# Patient Record
Sex: Male | Born: 1975 | Race: Black or African American | Hispanic: No | Marital: Single | Smoking: Never smoker
Health system: Southern US, Community
[De-identification: ages and names within clinical notes are randomized; demographics above are authoritative.]

## PROBLEM LIST (undated history)

## (undated) DIAGNOSIS — I10 Essential (primary) hypertension: Secondary | ICD-10-CM

## (undated) HISTORY — DX: Essential (primary) hypertension: I10

---

## 2004-07-04 ENCOUNTER — Emergency Department (HOSPITAL_COMMUNITY): Admission: EM | Admit: 2004-07-04 | Discharge: 2004-07-04 | Payer: Self-pay | Admitting: Emergency Medicine
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
## 2019-06-23 ENCOUNTER — Ambulatory Visit: Payer: Self-pay | Admitting: Registered Nurse

## 2019-06-24 ENCOUNTER — Encounter: Payer: Self-pay | Admitting: Registered Nurse

## 2019-07-21 ENCOUNTER — Other Ambulatory Visit: Payer: Self-pay

## 2019-07-21 ENCOUNTER — Ambulatory Visit (INDEPENDENT_AMBULATORY_CARE_PROVIDER_SITE_OTHER): Payer: 59 | Admitting: Family Medicine

## 2019-07-21 VITALS — BP 175/115 | HR 98 | Temp 98.4°F | Ht 63.0 in | Wt 171.6 lb

## 2019-07-21 DIAGNOSIS — H9319 Tinnitus, unspecified ear: Secondary | ICD-10-CM | POA: Insufficient documentation

## 2019-07-21 DIAGNOSIS — I10 Essential (primary) hypertension: Secondary | ICD-10-CM | POA: Insufficient documentation

## 2019-07-21 DIAGNOSIS — R9431 Abnormal electrocardiogram [ECG] [EKG]: Secondary | ICD-10-CM | POA: Diagnosis not present

## 2019-07-21 LAB — POCT URINALYSIS DIP (CLINITEK)
Bilirubin, UA: NEGATIVE
Glucose, UA: NEGATIVE mg/dL
Ketones, POC UA: NEGATIVE mg/dL
Leukocytes, UA: NEGATIVE
Nitrite, UA: NEGATIVE
POC PROTEIN,UA: 30 — AB
Spec Grav, UA: 1.025 (ref 1.010–1.025)
Urobilinogen, UA: 1 E.U./dL
pH, UA: 5.5 (ref 5.0–8.0)

## 2019-07-21 MED ORDER — AMLODIPINE BESYLATE 5 MG PO TABS: 5.0000 mg | ORAL_TABLET | Freq: Every day | ORAL | 1 refills | Status: DC

## 2019-07-21 NOTE — Patient Instructions (Addendum)
Blood pressure-take daily in the morning and write readings  Fasting labwork Cardiology referral

## 2019-07-21 NOTE — Progress Notes (Signed)
New Patient Office Visit  Subjective:  Patient ID: Martin Oconnor, male    DOB: Aug 22, 1975  Age: 43 y.o. MRN: [id number]  CC:  Chief Complaint  Patient presents with  . Establish Care  . Hypertension    possible/did not pass a job interview due to high blood pressure 150/105  . Tinnitus    left ear more than right    HPI Martin Oconnor presents for elevated blood pressure-no medication,  +visual changes, +headaches,+ tinnitus. Pt with HTN diagnosed in 2008 -no current medications. No blood pressure check until pt completed physical exam for employment. Pt was denied a physical due to elevated blood pressure  No FH HTN  Social History  Works in Set designer Socioeconomic History  . Marital status: Single    Spouse name: Not on file  . Number of children: Not on file  . Years of education: Not on file  . Highest education level: Not on file  Occupational History  . Not on file  Social Needs  . Financial resource strain: Not on file  . Food insecurity    Worry: Not on file    Inability: Not on file  . Transportation needs    Medical: Not on file    Non-medical: Not on file  Tobacco Use  . Smoking status: Not on file  Substance and Sexual Activity  . Alcohol use: Not on file  . Drug use: Not on file  . Sexual activity: Not on file  Lifestyle  . Physical activity    Days per week: Not on file    Minutes per session: Not on file  . Stress: Not on file  Relationships  . Social Musician on phone: Not on file    Gets together: Not on file    Attends religious service: Not on file    Active member of club or organization: Not on file    Attends meetings of clubs or organizations: Not on file    Relationship status: Not on file  . Intimate partner violence    Fear of current or ex partner: Not on file    Emotionally abused: Not on file    Physically abused: Not on file    Forced sexual activity: Not on file  Other Topics Concern  . Not on file   Social History Narrative  . Not on file     Current Problems (verified) Elevated blood pressure  Medications Prior to Visit none  Current Medications (verified) none   Allergies (verified) none  PAST HISTORY  Family History No FH HTN  Social History Social History   Tobacco Use  . Smoking status: Not on file  Substance Use Topics  . Alcohol use: Not on file   Screening Tests Health Maintenance  Topic Date Due  . HIV Screening  10/16/1988  . TETANUS/TDAP  10/16/1992  . INFLUENZA VACCINE  03/22/2019       Objective:  Body mass index is 30.4 kg/m. Today's Vitals   07/21/19 1506  BP: (!) 175/115  Pulse: 98  Temp: 98.4 F (36.9 C)  TempSrc: Oral  SpO2: 97%  Weight: 171 lb 9.6 oz (77.8 kg)  Height: 5\' 3"  (1.6 m)   Body mass index is 30.4 kg/m. {      Review of Systems  Constitutional: Negative.   HENT: Positive for tinnitus.   Eyes: Positive for visual disturbance.  Respiratory: Negative.   Cardiovascular: Negative.   Gastrointestinal: Negative.   Endocrine:  Negative.   Genitourinary: Negative.   Musculoskeletal: Negative.   Skin: Negative.   Allergic/Immunologic: Negative.   Neurological: Negative.   Hematological: Negative.   Psychiatric/Behavioral: Negative.     Objective:   Today's Vitals: BP (!) 175/115 (BP Location: Left Arm, Patient Position: Sitting, Cuff Size: Normal)   Pulse 98   Temp 98.4 F (36.9 C) (Oral)   Ht 5\' 3"  (1.6 m)   Wt 171 lb 9.6 oz (77.8 kg)   SpO2 97%   BMI 30.40 kg/m   Physical Exam Vitals signs reviewed.  Constitutional:      Appearance: Normal appearance.  HENT:     Head: Normocephalic and atraumatic.  Eyes:     Conjunctiva/sclera: Conjunctivae normal.  Neck:     Musculoskeletal: Normal range of motion and neck supple.  Cardiovascular:     Rate and Rhythm: Normal rate and regular rhythm.     Pulses: Normal pulses.     Heart sounds: Normal heart sounds.  Pulmonary:     Effort: Pulmonary effort  is normal.     Breath sounds: Normal breath sounds.  Musculoskeletal: Normal range of motion.  Neurological:     Mental Status: He is alert.  Psychiatric:        Mood and Affect: Mood normal.        Behavior: Behavior normal.     Assessment & Plan:  1. Essential hypertension Amlodipine 5mg -rx-risk/benefit/side effects - EKG 12-Lead - CBC with Differential - COMPLETE METABOLIC PANEL WITH GFR - TSH - Lipid panel - POCT URINALYSIS DIP (CLINITEK) - Ambulatory referral to Cardiology SR with T wave abnormality-V2-V6, III 2. Tinnitus, unspecified laterality Concern relate to elevated bp 3. Abnormal ECG  Follow-up: cardio  LISA Hannah Beat, MD

## 2019-07-25 ENCOUNTER — Encounter: Payer: Self-pay | Admitting: *Deleted

## 2019-07-25 ENCOUNTER — Ambulatory Visit: Payer: 59 | Admitting: Cardiology

## 2019-07-25 ENCOUNTER — Encounter: Payer: Self-pay | Admitting: Cardiology

## 2019-07-25 NOTE — Progress Notes (Deleted)
No show

## 2019-08-18 ENCOUNTER — Encounter: Payer: Self-pay | Admitting: Family Medicine

## 2019-08-18 ENCOUNTER — Telehealth (INDEPENDENT_AMBULATORY_CARE_PROVIDER_SITE_OTHER): Payer: 59 | Admitting: Family Medicine

## 2019-08-18 ENCOUNTER — Other Ambulatory Visit: Payer: Self-pay

## 2019-08-18 VITALS — BP 145/91 | HR 80 | Ht 63.0 in | Wt 170.0 lb

## 2019-08-18 DIAGNOSIS — R9431 Abnormal electrocardiogram [ECG] [EKG]: Secondary | ICD-10-CM

## 2019-08-18 DIAGNOSIS — I10 Essential (primary) hypertension: Secondary | ICD-10-CM

## 2019-08-18 MED ORDER — LOSARTAN POTASSIUM 50 MG PO TABS: 50.0000 mg | ORAL_TABLET | Freq: Every day | ORAL | 1 refills | Status: DC

## 2019-08-18 NOTE — Progress Notes (Signed)
Virtual Visit via Telephone Note  I connected with Martin Oconnor on 08/18/19 at  1:40 PM EST by telephone and verified that I am speaking with the correct person using two identifiers.DOB/address  Location: Patient: home Provider:clinic   I discussed the limitations, risks, security and privacy concerns of performing an evaluation and management service by telephone and the availability of in person appointments. I also discussed with the patient that there may be a patient responsible charge related to this service. The patient expressed understanding and agreed to proceed.   History of Present Illness: Pt took amlodipine for blood pressure -noted rash on the back of the hand-itchy and irritated. Pt states burning sensation not noted prior to starting medication. Pt went off medication and rash almost gone.  Pt has not taken blood pressure medications in the past. Pt has not completed labwork   Observations/Objective: 145/90, 132/88-home bp  Assessment and Plan:  1. Essential hypertension Stop amlodipine-rash noted on hands, start Lotensin 50mg  daily labwork-fasting  2. Abnormal ECG Cardio evaluation next week Follow Up Instructions: Fasting labwork   I discussed the assessment and treatment plan with the patient. The patient was provided an opportunity to ask questions and all were answered. The patient agreed with the plan and demonstrated an understanding of the instructions.   The patient was advised to call back or seek an in-person evaluation if the symptoms worsen or if the condition fails to improve as anticipated.  I provided 8 minutes of non-face-to-face time during this encounter.   Kianna Billet Hannah Beat, MD

## 2019-08-19 NOTE — Progress Notes (Deleted)
CARDIOLOGY CONSULT NOTE       Patient ID: Martin Oconnor MRN: [id number] DOB/AGE: 1976-02-16 43 y.o.  Admit date: (Not on file) Referring Physician: Corum Primary Physician: Maryruth Hancock, MD Primary Cardiologist: new Reason for Consultation: Abnormal ECG  Active Problems:   * No active hospital problems. *   HPI:  43 y.o. with HTN. Referred by Dr Holly Bodily for abnormal ECG. Recently BP Rx with norvasc but patient developed a rash and stopped. No Rx with Lotensin 50 mg daily She has long standing HTN as far back as 2008 She has not been on meds regularly Only established care with primary in November. Denied employment on exam when found to be poorly controlled BP.  She has had some issues with tinnitus and blurry vision   ROS All other systems reviewed and negative except as noted above  Past Medical History:  Diagnosis Date  . Uncontrolled hypertension     No family history on file.  Social History   Socioeconomic History  . Marital status: Single    Spouse name: Not on file  . Number of children: Not on file  . Years of education: Not on file  . Highest education level: Not on file  Occupational History  . Not on file  Tobacco Use  . Smoking status: Not on file  Substance and Sexual Activity  . Alcohol use: Not Currently  . Drug use: Never  . Sexual activity: Yes  Other Topics Concern  . Not on file  Social History Narrative  . Not on file   Social Determinants of Health   Financial Resource Strain:   . Difficulty of Paying Living Expenses: Not on file  Food Insecurity:   . Worried About Charity fundraiser in the Last Year: Not on file  . Ran Out of Food in the Last Year: Not on file  Transportation Needs:   . Lack of Transportation (Medical): Not on file  . Lack of Transportation (Non-Medical): Not on file  Physical Activity:   . Days of Exercise per Week: Not on file  . Minutes of Exercise per Session: Not on file  Stress:   . Feeling of Stress :  Not on file  Social Connections:   . Frequency of Communication with Friends and Family: Not on file  . Frequency of Social Gatherings with Friends and Family: Not on file  . Attends Religious Services: Not on file  . Active Member of Clubs or Organizations: Not on file  . Attends Archivist Meetings: Not on file  . Marital Status: Not on file  Intimate Partner Violence:   . Fear of Current or Ex-Partner: Not on file  . Emotionally Abused: Not on file  . Physically Abused: Not on file  . Sexually Abused: Not on file    *** The histories are not reviewed yet. Please review them in the "History" navigator section and refresh this [place].      Physical Exam: There were no vitals taken for this visit.    Affect appropriate Healthy:  appears stated age 25: normal Neck supple with no adenopathy JVP normal no bruits no thyromegaly Lungs clear with no wheezing and good diaphragmatic motion Heart:  S1/S2 no murmur, no rub, gallop or click PMI normal Abdomen: benighn, BS positve, no tenderness, no AAA no bruit.  No HSM or HJR Distal pulses intact with no bruits No edema Neuro non-focal Skin warm and dry No muscular weakness    Radiology: No  results found.  EKG: ***   ASSESSMENT AND PLAN:   1. HTN:  *** 2. Abnormal ECG:  ***  Signed: Charlton Haws 08/19/2019, 5:42 PM

## 2019-08-27 ENCOUNTER — Ambulatory Visit: Payer: 59 | Admitting: Cardiovascular Disease

## 2019-08-28 ENCOUNTER — Encounter: Payer: Self-pay | Admitting: Cardiovascular Disease

## 2019-09-01 ENCOUNTER — Telehealth: Payer: Self-pay | Admitting: Family Medicine

## 2019-09-01 NOTE — Telephone Encounter (Signed)
Patient is calling and states he started the losartan (COZAAR) 50 MG tablet  And states 2 hours later he started feeling strange, as in he was feeling very weak and his blood pressure was 111/80 with heart rate 106. He states he went to sleep because he was not feeling well and when he woke up he had more energy and felt the best he had felt in awhile.   Patient states he has not taken the medication since that one time.   He would like advise.

## 2019-09-01 NOTE — Telephone Encounter (Signed)
Patient is scheduled for a phone visit in the morning

## 2019-09-01 NOTE — Telephone Encounter (Signed)
Routing to Dr. Corum for advice ? 

## 2019-09-01 NOTE — Telephone Encounter (Signed)
Schedule appt to discuss

## 2019-09-02 ENCOUNTER — Other Ambulatory Visit: Payer: Self-pay

## 2019-09-02 ENCOUNTER — Telehealth (INDEPENDENT_AMBULATORY_CARE_PROVIDER_SITE_OTHER): Payer: 59 | Admitting: Family Medicine

## 2019-09-02 VITALS — BP 155/106 | HR 89 | Ht 63.0 in | Wt 171.0 lb

## 2019-09-02 DIAGNOSIS — I10 Essential (primary) hypertension: Secondary | ICD-10-CM

## 2019-09-02 NOTE — Progress Notes (Signed)
Virtual Visit via Telephone Note  I connected with Martin Oconnor on 09/02/19 at  8:20 AM EST by telephone and verified that I am speaking with the correct person using two identifiers. DOB/address  Location: Patient: home Provider: home   I discussed the limitations, risks, security and privacy concerns of performing an evaluation and management service by telephone and the availability of in person appointments. I also discussed with the patient that there may be a patient responsible charge related to this service. The patient expressed understanding and agreed to proceed.   History of Present Illness:   pt took Cozaar with concern for fatigue . After sleeping overnight pt states he felt normal.  No rash. No dizziness. No CP. No SOB Observations/Objective: 110/70's  Assessment and Plan: 1. Essential hypertension Cut cozaar in 1/2-take one half dose x 1 week Follow Up Instructions: Continue taking 1/2 dose -monitor blood pressure readings Completed blood work when work shift allows.   I discussed the assessment and treatment plan with the patient. The patient was provided an opportunity to ask questions and all were answered. The patient agreed with the plan and demonstrated an understanding of the instructions.   The patient was advised to call back or seek an in-person evaluation if the symptoms worsen or if the condition fails to improve as anticipated.  I provided 7 minutes of non-face-to-face time during this encounter.   Latitia Housewright Mat Carne, MD

## 2020-01-22 ENCOUNTER — Other Ambulatory Visit: Payer: Self-pay | Admitting: Family Medicine

## 2020-01-22 NOTE — Telephone Encounter (Signed)
Please advise. No lab work done and has not been seen in office in over 6 months.

## 2020-02-03 DIAGNOSIS — Z1389 Encounter for screening for other disorder: Secondary | ICD-10-CM | POA: Diagnosis not present

## 2020-02-03 DIAGNOSIS — Z0001 Encounter for general adult medical examination with abnormal findings: Secondary | ICD-10-CM | POA: Diagnosis not present

## 2020-02-03 DIAGNOSIS — I10 Essential (primary) hypertension: Secondary | ICD-10-CM | POA: Diagnosis not present

## 2020-02-03 DIAGNOSIS — E663 Overweight: Secondary | ICD-10-CM | POA: Diagnosis not present

## 2020-02-03 DIAGNOSIS — M222X2 Patellofemoral disorders, left knee: Secondary | ICD-10-CM | POA: Diagnosis not present

## 2020-02-03 DIAGNOSIS — Z13220 Encounter for screening for lipoid disorders: Secondary | ICD-10-CM | POA: Diagnosis not present

## 2020-02-03 DIAGNOSIS — Z6827 Body mass index (BMI) 27.0-27.9, adult: Secondary | ICD-10-CM | POA: Diagnosis not present

## 2020-02-03 DIAGNOSIS — Z0000 Encounter for general adult medical examination without abnormal findings: Secondary | ICD-10-CM | POA: Diagnosis not present

## 2020-09-08 ENCOUNTER — Other Ambulatory Visit: Payer: Self-pay

## 2020-09-08 DIAGNOSIS — Z20822 Contact with and (suspected) exposure to covid-19: Secondary | ICD-10-CM | POA: Diagnosis not present

## 2020-09-09 ENCOUNTER — Ambulatory Visit
Admission: RE | Admit: 2020-09-09 | Discharge: 2020-09-09 | Disposition: A | Discharge status: Home / Self Care | Payer: BC Managed Care – PPO | Source: Ambulatory Visit | Attending: Emergency Medicine | Admitting: Emergency Medicine

## 2020-09-09 ENCOUNTER — Other Ambulatory Visit: Payer: Self-pay

## 2020-09-09 VITALS — BP 148/108 | HR 106 | Temp 98.5°F | Resp 19

## 2020-09-09 DIAGNOSIS — Z1152 Encounter for screening for COVID-19: Secondary | ICD-10-CM | POA: Insufficient documentation

## 2020-09-09 DIAGNOSIS — J069 Acute upper respiratory infection, unspecified: Secondary | ICD-10-CM | POA: Diagnosis not present

## 2020-09-09 DIAGNOSIS — J029 Acute pharyngitis, unspecified: Secondary | ICD-10-CM | POA: Insufficient documentation

## 2020-09-09 DIAGNOSIS — R059 Cough, unspecified: Secondary | ICD-10-CM | POA: Diagnosis not present

## 2020-09-09 LAB — POCT RAPID STREP A (OFFICE): Rapid Strep A Screen: NEGATIVE

## 2020-09-09 MED ORDER — LIDOCAINE VISCOUS HCL 2 % MT SOLN: 15.0000 mL | OROMUCOSAL | 0 refills | Status: DC | PRN

## 2020-09-09 MED ORDER — CETIRIZINE HCL 10 MG PO TABS: 10.0000 mg | ORAL_TABLET | Freq: Every day | ORAL | 0 refills | Status: DC

## 2020-09-09 MED ORDER — DEXAMETHASONE 4 MG PO TABS: 4.0000 mg | ORAL_TABLET | Freq: Every day | ORAL | 0 refills | Status: AC

## 2020-09-09 MED ORDER — BENZONATATE 100 MG PO CAPS: 100.0000 mg | ORAL_CAPSULE | Freq: Three times a day (TID) | ORAL | 0 refills | Status: DC | PRN

## 2020-09-09 NOTE — ED Provider Notes (Signed)
[facility]   [id number] 09/09/20 Arrival Time: 1201   CC: COVID symptoms  SUBJECTIVE: History from: patient and family.  Martin Oconnor is a 45 y.o. male who presented to the urgent care for complaint of sore throat and cough for the past 5 days.  Denies sick exposure to COVID, flu or strep.  Denies recent travel.  Has tried OTC medication without relief.  Denies review aggravating factors.  Denies previous symptoms in the past.   Denies fever, chills, fatigue, sinus pain, rhinorrhea, sore throat, SOB, wheezing, chest pain, nausea, changes in bowel or bladder habits.     ROS: As per HPI.  All other pertinent ROS negative.      Past Medical History:  Diagnosis Date  . Uncontrolled hypertension    History reviewed. No pertinent surgical history. Allergies  Allergen Reactions  . Lisinopril Hives   No current facility-administered medications on file prior to encounter.   Current Outpatient Medications on File Prior to Encounter  Medication Sig Dispense Refill  . losartan (COZAAR) 50 MG tablet TAKE 1 TABLET(50 MG) BY MOUTH DAILY 30 tablet 0   Social History   Socioeconomic History  . Marital status: Single    Spouse name: Not on file  . Number of children: Not on file  . Years of education: Not on file  . Highest education level: Not on file  Occupational History  . Not on file  Tobacco Use  . Smoking status: Never Smoker  . Smokeless tobacco: Never Used  Substance and Sexual Activity  . Alcohol use: Not Currently  . Drug use: Never  . Sexual activity: Yes  Other Topics Concern  . Not on file  Social History Narrative  . Not on file   Social Determinants of Health   Financial Resource Strain: Not on file  Food Insecurity: Not on file  Transportation Needs: Not on file  Physical Activity: Not on file  Stress: Not on file  Social Connections: Not on file  Intimate Partner Violence: Not on file   No family history on  file.  OBJECTIVE:  Vitals:   09/09/20 1228  BP: (!) 148/108  Pulse: (!) 106  Resp: 19  Temp: 98.5 F (36.9 C)  TempSrc: Oral  SpO2: 97%     General appearance: alert; appears fatigued, but nontoxic; speaking in full sentences and tolerating own secretions HEENT: NCAT; Ears: EACs clear, TMs pearly gray; Eyes: PERRL.  EOM grossly intact. Sinuses: nontender; Nose: nares patent without rhinorrhea, Throat: oropharynx clear, tonsils non erythematous or enlarged, uvula midline  Neck: supple without LAD Lungs: unlabored respirations, symmetrical air entry; cough: moderate; no respiratory distress; CTAB Heart: regular rate and rhythm.  Radial pulses 2+ symmetrical bilaterally Skin: warm and dry Psychological: alert and cooperative; normal mood and affect  LABS:  Results for orders placed or performed during the hospital encounter of 09/09/20 (from the past 24 hour(s))  POCT rapid strep A     Status: None   Collection Time: 09/09/20 12:44 PM  Result Value Ref Range   Rapid Strep A Screen Negative Negative     ASSESSMENT & PLAN:  1. Sore throat   2. Viral URI with cough   3. Encounter for screening for COVID-19     Meds ordered this encounter  Medications  . cetirizine (ZYRTEC ALLERGY) 10 MG tablet    Sig: Take 1 tablet (10 mg total) by mouth daily.    Dispense:  30 tablet    Refill:  0  .  benzonatate (TESSALON) 100 MG capsule    Sig: Take 1 capsule (100 mg total) by mouth 3 (three) times daily as needed for cough.    Dispense:  30 capsule    Refill:  0  . lidocaine (XYLOCAINE) 2 % solution    Sig: Use as directed 15 mLs in the mouth or throat as needed for mouth pain.    Dispense:  100 mL    Refill:  0  . dexamethasone (DECADRON) 4 MG tablet    Sig: Take 1 tablet (4 mg total) by mouth daily for 5 days.    Dispense:  5 tablet    Refill:  0   Discharge instructions  COVID testing ordered.  It will take between 2-7 days for test results.  Someone will contact you  regarding abnormal results.    Get plenty of rest and push fluids Tessalon Perles prescribed for cough Zyrtec was prescribed for nasal congestion and sore throat Lidocaine mouthwash was prescribed for sore throat Decadron was prescribed Use OTC throat lozenges such as Halls, Cepacol or Vicks to soothe throat Use medications daily for symptom relief Use OTC medications like ibuprofen or tylenol as needed fever or pain Call or go to the ED if you have any new or worsening symptoms such as fever, worsening cough, shortness of breath, chest tightness, chest pain, turning blue, changes in mental status, etc...   Reviewed expectations re: course of current medical issues. Questions answered. Outlined signs and symptoms indicating need for more acute intervention. Patient verbalized understanding. After Visit Summary given.         Durward Parcel, FNP 09/09/20 1252

## 2020-09-09 NOTE — Discharge Instructions (Signed)
COVID testing ordered.  It will take between 2-7 days for test results.  Someone will contact you regarding abnormal results.    Get plenty of rest and push fluids Tessalon Perles prescribed for cough Zyrtec was prescribed for nasal congestion and sore throat Lidocaine mouthwash was prescribed for sore throat Decadron was prescribed Use OTC throat lozenges such as Halls, Cepacol or Vicks to soothe throat Use medications daily for symptom relief Use OTC medications like ibuprofen or tylenol as needed fever or pain Call or go to the ED if you have any new or worsening symptoms such as fever, worsening cough, shortness of breath, chest tightness, chest pain, turning blue, changes in mental status, e

## 2020-09-09 NOTE — ED Triage Notes (Signed)
Sore throat and mild cough since 09/04/2020

## 2020-09-10 LAB — SARS-COV-2, NAA 2 DAY TAT

## 2020-09-10 LAB — NOVEL CORONAVIRUS, NAA
SARS-CoV-2, NAA: DETECTED — AB
SARS-CoV-2, NAA: DETECTED — AB

## 2020-09-11 ENCOUNTER — Other Ambulatory Visit (INDEPENDENT_AMBULATORY_CARE_PROVIDER_SITE_OTHER): Payer: Self-pay | Admitting: Family Medicine

## 2020-09-12 LAB — CULTURE, GROUP A STREP (THRC): Special Requests: NORMAL

## 2020-09-18 ENCOUNTER — Telehealth: Payer: BC Managed Care – PPO | Admitting: Nurse Practitioner

## 2020-09-18 DIAGNOSIS — K029 Dental caries, unspecified: Secondary | ICD-10-CM | POA: Diagnosis not present

## 2020-09-18 MED ORDER — AMOXICILLIN 500 MG PO CAPS: 500.0000 mg | ORAL_CAPSULE | Freq: Three times a day (TID) | ORAL | 0 refills | Status: DC

## 2020-09-18 MED ORDER — IBUPROFEN 600 MG PO TABS: 600.0000 mg | ORAL_TABLET | Freq: Three times a day (TID) | ORAL | 0 refills | Status: DC | PRN

## 2020-09-18 MED ORDER — LOSARTAN POTASSIUM 50 MG PO TABS: ORAL_TABLET | ORAL | 0 refills | Status: DC

## 2020-09-18 NOTE — Progress Notes (Signed)
E-Visit for Dental Pain  We are sorry that you are not feeling well.  Here is how we plan to help!  Based on what you have shared with me in the questionnaire, it sounds like you have dental pain  Ibuprofen 600mg 3 times a day for 7 days for discomfort and Amoxicillin 500mg 3 times per day for 10 days  It is imperative that you see a dentist within 10 days of this eVisit to determine the cause of the dental pain and be sure it is adequately treated  A toothache or tooth pain is caused when the nerve in the root of a tooth or surrounding a tooth is irritated. Dental (tooth) infection, decay, injury, or loss of a tooth are the most common causes of dental pain. Pain may also occur after an extraction (tooth is pulled out). Pain sometimes originates from other areas and radiates to the jaw, thus appearing to be tooth pain.Bacteria growing inside your mouth can contribute to gum disease and dental decay, both of which can cause pain. A toothache occurs from inflammation of the central portion of the tooth called pulp. The pulp contains nerve endings that are very sensitive to pain. Inflammation to the pulp or pulpitis may be caused by dental cavities, trauma, and infection.    HOME CARE:   For toothaches: Over-the-counter pain medications such as acetaminophen or ibuprofen may be used. Take these as directed on the package while you arrange for a dental appointment. Avoid very cold or hot foods, because they may make the pain worse. You may get relief from biting on a cotton ball soaked in oil of cloves. You can get oil of cloves at most drug stores.  For jaw pain:  Aspirin may be helpful for problems in the joint of the jaw in adults. If pain happens every time you open your mouth widely, the temporomandibular joint (TMJ) may be the source of the pain. Yawning or taking a large bite of food may worsen the pain. An appointment with your doctor or dentist will help you find the cause.     GET  HELP RIGHT AWAY IF:  You have a high fever or chills If you have had a recent head or face injury and develop headache, light headedness, nausea, vomiting, or other symptoms that concern you after an injury to your face or mouth, you could have a more serious injury in addition to your dental injury. A facial rash associated with a toothache: This condition may improve with medication. Contact your doctor for them to decide what is appropriate. Any jaw pain occurring with chest pain: Although jaw pain is most commonly caused by dental disease, it is sometimes referred pain from other areas. People with heart disease, especially people who have had stents placed, people with diabetes, or those who have had heart surgery may have jaw pain as a symptom of heart attack or angina. If your jaw or tooth pain is associated with lightheadedness, sweating, or shortness of breath, you should see a doctor as soon as possible. Trouble swallowing or excessive pain or bleeding from gums: If you have a history of a weakened immune system, diabetes, or steroid use, you may be more susceptible to infections. Infections can often be more severe and extensive or caused by unusual organisms. Dental and gum infections in people with these conditions may require more aggressive treatment. An abscess may need draining or IV antibiotics, for example.  MAKE SURE YOU   Understand these instructions.   Will watch your condition. Will get help right away if you are not doing well or get worse.  Thank you for choosing an e-visit.  Your e-visit answers were reviewed by a board certified advanced clinical practitioner to complete your personal care plan. Depending upon the condition, your plan could have included both over the counter or prescription medications.  Please review your pharmacy choice. Make sure the pharmacy is open so you can pick up prescription now. If there is a problem, you may contact your provider through  MyChart messaging and have the prescription routed to another pharmacy.  Your safety is important to us. If you have drug allergies check your prescription carefully.   For the next 24 hours you can use MyChart to ask questions about today's visit, request a non-urgent call back, or ask for a work or school excuse. You will get an email in the next two days asking about your experience. I hope that your e-visit has been valuable and will speed your recovery.  5-10 minutes spent reviewing and documenting in chart.  

## 2020-09-18 NOTE — Addendum Note (Signed)
Addended by: Bennie Pierini on: 09/18/2020 07:11 PM   Modules accepted: Orders

## 2020-09-24 DIAGNOSIS — Z20822 Contact with and (suspected) exposure to covid-19: Secondary | ICD-10-CM | POA: Diagnosis not present

## 2020-10-18 ENCOUNTER — Other Ambulatory Visit: Payer: Self-pay | Admitting: Nurse Practitioner

## 2021-10-28 ENCOUNTER — Ambulatory Visit: Payer: BC Managed Care – PPO | Admitting: Surgery

## 2021-11-01 ENCOUNTER — Encounter: Payer: Self-pay | Admitting: Surgery

## 2021-11-01 ENCOUNTER — Other Ambulatory Visit: Payer: Self-pay

## 2021-11-01 ENCOUNTER — Ambulatory Visit: Payer: Managed Care, Other (non HMO) | Admitting: Surgery

## 2021-11-01 VITALS — BP 138/92 | HR 76 | Temp 97.3°F | Resp 14 | Ht 63.0 in | Wt 134.0 lb

## 2021-11-01 DIAGNOSIS — K4090 Unilateral inguinal hernia, without obstruction or gangrene, not specified as recurrent: Secondary | ICD-10-CM

## 2021-11-01 NOTE — Progress Notes (Signed)
[facility] History and Physical ? ?Reason for Referral: Right inguinal hernia ?Referring Physician: Elfredia Nevins, MD ? ?Chief Complaint   ?New Patient (Initial Visit) ?  ? ? ?Martin Oconnor is a 46 y.o. male.  ?HPI: Patient presents for evaluation of a right inguinal hernia.  He states that he believes it has been present for about 4 months.  After lifting some heavy weights at work, he noted some pain in his right groin with an associated bulge.  The bulge is soft and able to be pushed back in when he is laying flat, but then it will come out when he is standing up or exerting himself.  He denies nausea and vomiting, and is moving his bowels without issue.  His past medical history significant for hypertension.  He has no history of any abdominal surgeries.  He denies using tobacco products, alcohol, and illegal drugs.  He denies use of blood thinning medications.  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 ? ?Past Medical History:  ?Diagnosis Date  ? Uncontrolled hypertension   ? ? ?No past surgical history on file. ? ?No family history on file. ? ?Social History  ? ?Tobacco Use  ? Smoking status: Never  ?  Passive exposure: Never  ? Smokeless tobacco: Never  ?Substance Use Topics  ? Alcohol use: Not Currently  ? Drug use: Never  ? ? ?Medications: I have reviewed the patient's current medications. ?Allergies as of 11/01/2021   ? ?   Reactions  ? Lisinopril Hives  ? ?  ? ?  ?Medication List  ?  ? ?  ? Accurate as of November 01, 2021 12:14 PM. If you have any questions, ask your nurse or doctor.  ?  ?  ? ?  ? ?STOP taking these medications   ? ?amoxicillin 500 MG capsule ?Commonly known as: AMOXIL ?Stopped by: Lewie Chamber, DO ?  ?benzonatate 100 MG capsule ?Commonly known as: TESSALON ?Stopped by: Lewie Chamber, DO ?  ?cetirizine 10 MG tablet ?Commonly known as: ZyrTEC Allergy ?Stopped by: Lewie Chamber, DO ?  ?ibuprofen 600 MG tablet ?Commonly known as: ADVIL ?Stopped by: Lewie Chamber, DO ?  ?lidocaine 2 % solution ?Commonly known as: XYLOCAINE ?Stopped by: Lewie Chamber, DO ?  ?losartan 50 MG tablet ?Commonly known as: COZAAR ?Stopped by: Lewie Chamber, DO ?  ? ?  ? ? ? ?ROS:  ?Constitutional: negative for chills, fatigue, and fevers ?Eyes: negative for visual disturbance and pain ?Ears, nose, mouth, throat, and face: negative for ear drainage, sore throat, and sinus problems ?Respiratory: negative for cough, wheezing, and shortness of breath ?Cardiovascular: negative for chest pain and palpitations ?Gastrointestinal: negative for abdominal pain, nausea, reflux symptoms, and vomiting ?Genitourinary:negative for dysuria, frequency, and urinary retention ?Integument/breast: negative for dryness and rash ?Hematologic/lymphatic: negative for bleeding and lymphadenopathy ?Musculoskeletal:negative for back pain, neck pain, and joint pain ?Neurological: negative for dizziness, tremors, and numbness ?Endocrine: negative for temperature intolerance ? ?Blood pressure (!) 138/92, pulse 76, temperature (!) 97.3 ?F (36.3 ?C), temperature source Other (Comment), resp. rate 14, height 5\' 3"  (1.6 m), weight 134 lb (60.8 kg), SpO2 98 %. ?Physical Exam ?Vitals reviewed.  ?Constitutional:   ?   Appearance: Normal appearance.  ?HENT:  ?   Head: Normocephalic and atraumatic.  ?Eyes:  ?   Extraocular Movements: Extraocular movements intact.  ?  Pupils: Pupils are equal, round, and reactive to light.  ?Cardiovascular:  ?   Rate and Rhythm: Normal rate.  ?Pulmonary:  ?   Effort: Pulmonary effort is normal.  ?Abdominal:  ?   General: There is no distension.  ?   Palpations: Abdomen is soft.  ?   Tenderness: There is no abdominal tenderness.  ?Genitourinary: ?   Comments: Right inguinal hernia, soft and reducible; no left inguinal hernia ?Musculoskeletal:     ?   General: Normal range of motion.  ?    Cervical back: Normal range of motion.  ?Skin: ?   General: Skin is warm and dry.  ?Neurological:  ?   General: No focal deficit present.  ?   Mental Status: He is alert and oriented to person, place, and time.  ?Psychiatric:     ?   Mood and Affect: Mood normal.     ?   Behavior: Behavior normal.  ? ? ?Results: ?No results found for this or any previous visit (from the past 48 hour(s)). ? ?No results found. ? ? ?Assessment & Plan:  ?Martin Oconnor is a 46 y.o. male who presents for evaluation of a right inguinal hernia. ? ?-We discussed the pathology of inguinal hernias and the procedure for the open right inguinal hernia.  The risk and benefits of open right inguinal hernia with mesh were discussed, including but not limited to bleeding, infection, injury to surrounding structures including nerves, hernia recurrence, and need for additional procedure.  After careful consideration, Martin Oconnor has decided to proceed with this procedure. ?-I discussed that postoperatively, he would cannot lift more than 10 pounds for at least 4 weeks.  He believes that he will be able to perform light duty at work, but he will check.   ?-I recommend that he take 2 weeks off from work, and we make a decision at his first follow-up appointment as to whether he should be going back to work or not.  If he does go back to work, I explained that he cannot lift more than 10 pounds and he will likely need a chair to occasionally sit down given the recent surgery ?-Patient tentatively scheduled for surgery on 3/24 ? ?All questions were answered to the satisfaction of the patient. ? ?Theophilus Kinds, DO ?[facility] ?[address] ?[place], [place] [address] ?[phone] (office) ? ? ? ? ? ?

## 2021-11-02 NOTE — H&P (Signed)
[facility] History and Physical ? ?Reason for Referral: Right inguinal hernia ?Referring Physician: Elfredia Nevins, MD ? ?Chief Complaint   ?New Patient (Initial Visit) ?  ? ? ?Martin Oconnor is a 46 y.o. male.  ?HPI: Patient presents for evaluation of a right inguinal hernia.  He states that he believes it has been present for about 4 months.  After lifting some heavy weights at work, he noted some pain in his right groin with an associated bulge.  The bulge is soft and able to be pushed back in when he is laying flat, but then it will come out when he is standing up or exerting himself.  He denies nausea and vomiting, and is moving his bowels without issue.  His past medical history significant for hypertension.  He has no history of any abdominal surgeries.  He denies using tobacco products, alcohol, and illegal drugs.  He denies use of blood thinning medications.  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 ? ?Past Medical History:  ?Diagnosis Date  ? Uncontrolled hypertension   ? ? ?No past surgical history on file. ? ?No family history on file. ? ?Social History  ? ?Tobacco Use  ? Smoking status: Never  ?  Passive exposure: Never  ? Smokeless tobacco: Never  ?Substance Use Topics  ? Alcohol use: Not Currently  ? Drug use: Never  ? ? ?Medications: I have reviewed the patient's current medications. ?Allergies as of 11/01/2021   ? ?   Reactions  ? Lisinopril Hives  ? ?  ? ?  ?Medication List  ?  ? ?  ? Accurate as of November 01, 2021 12:14 PM. If you have any questions, ask your nurse or doctor.  ?  ?  ? ?  ? ?STOP taking these medications   ? ?amoxicillin 500 MG capsule ?Commonly known as: AMOXIL ?Stopped by: Lewie Chamber, DO ?  ?benzonatate 100 MG capsule ?Commonly known as: TESSALON ?Stopped by: Lewie Chamber, DO ?  ?cetirizine 10 MG tablet ?Commonly known as: ZyrTEC Allergy ?Stopped by: Lewie Chamber, DO ?  ?ibuprofen 600 MG tablet ?Commonly known as: ADVIL ?Stopped by: Lewie Chamber, DO ?  ?lidocaine 2 % solution ?Commonly known as: XYLOCAINE ?Stopped by: Lewie Chamber, DO ?  ?losartan 50 MG tablet ?Commonly known as: COZAAR ?Stopped by: Lewie Chamber, DO ?  ? ?  ? ? ? ?ROS:  ?Constitutional: negative for chills, fatigue, and fevers ?Eyes: negative for visual disturbance and pain ?Ears, nose, mouth, throat, and face: negative for ear drainage, sore throat, and sinus problems ?Respiratory: negative for cough, wheezing, and shortness of breath ?Cardiovascular: negative for chest pain and palpitations ?Gastrointestinal: negative for abdominal pain, nausea, reflux symptoms, and vomiting ?Genitourinary:negative for dysuria, frequency, and urinary retention ?Integument/breast: negative for dryness and rash ?Hematologic/lymphatic: negative for bleeding and lymphadenopathy ?Musculoskeletal:negative for back pain, neck pain, and joint pain ?Neurological: negative for dizziness, tremors, and numbness ?Endocrine: negative for temperature intolerance ? ?Blood pressure (!) 138/92, pulse 76, temperature (!) 97.3 ?F (36.3 ?C), temperature source Other (Comment), resp. rate 14, height 5\' 3"  (1.6 m), weight 134 lb (60.8 kg), SpO2 98 %. ?Physical Exam ?Vitals reviewed.  ?Constitutional:   ?   Appearance: Normal appearance.  ?HENT:  ?   Head: Normocephalic and atraumatic.  ?Eyes:  ?   Extraocular Movements: Extraocular movements intact.  ?  Pupils: Pupils are equal, round, and reactive to light.  ?Cardiovascular:  ?   Rate and Rhythm: Normal rate.  ?Pulmonary:  ?   Effort: Pulmonary effort is normal.  ?Abdominal:  ?   General: There is no distension.  ?   Palpations: Abdomen is soft.  ?   Tenderness: There is no abdominal tenderness.  ?Genitourinary: ?   Comments: Right inguinal hernia, soft and reducible; no left inguinal hernia ?Musculoskeletal:     ?   General: Normal range of motion.  ?    Cervical back: Normal range of motion.  ?Skin: ?   General: Skin is warm and dry.  ?Neurological:  ?   General: No focal deficit present.  ?   Mental Status: He is alert and oriented to person, place, and time.  ?Psychiatric:     ?   Mood and Affect: Mood normal.     ?   Behavior: Behavior normal.  ? ? ?Results: ?No results found for this or any previous visit (from the past 48 hour(s)). ? ?No results found. ? ? ?Assessment & Plan:  ?Martin Oconnor is a 46 y.o. male who presents for evaluation of a right inguinal hernia. ? ?-We discussed the pathology of inguinal hernias and the procedure for the open right inguinal hernia.  The risk and benefits of open right inguinal hernia with mesh were discussed, including but not limited to bleeding, infection, injury to surrounding structures including nerves, hernia recurrence, and need for additional procedure.  After careful consideration, Martin Oconnor has decided to proceed with this procedure. ?-I discussed that postoperatively, he would cannot lift more than 10 pounds for at least 4 weeks.  He believes that he will be able to perform light duty at work, but he will check.   ?-I recommend that he take 2 weeks off from work, and we make a decision at his first follow-up appointment as to whether he should be going back to work or not.  If he does go back to work, I explained that he cannot lift more than 10 pounds and he will likely need a chair to occasionally sit down given the recent surgery ?-Patient tentatively scheduled for surgery on 3/24 ? ?All questions were answered to the satisfaction of the patient. ? ?Graciella Freer, DO ?[facility] ?[place], [place] [address] ?[phone] (office) ?

## 2021-11-04 NOTE — Patient Instructions (Signed)
? ? ? ? ? ? ? ? Martin Oconnor ? 11/04/2021  ?  ? @PREFPERIOPPHARMACY @ ? ? Your procedure is scheduled on  11/11/2021. ? ? Report to 11/13/2021 at  0600 A.M. ? ? Call this number if you have problems the morning of surgery: ? [phone] ? ? Remember: ? Do not eat or drink after midnight. ?  ?  ? Take these medicines the morning of surgery with A SIP OF WATER  ? ?None ?  ? Do not wear jewelry, make-up or nail polish. ? Do not wear lotions, powders, or perfumes, or deodorant. ? Do not shave 48 hours prior to surgery.  Men may shave face and neck. ? Do not bring valuables to the hospital. ? [facility] Ocon[facility] is not responsible for any belongings or valuables. ? ?Contacts, dentures or bridgework may not be worn into surgery.  Leave your suitcase in the car.  After surgery it may be brought to your room. ? ?For patients admitted to the hospital, discharge time will be determined by your treatment team. ? ?Patients discharged the day of surgery will not be allowed to drive home and must have someone with them for 24 hours.  ? ? ?Special instructions:   DO NOT smoke tobacco or vape for 24 hours before your procedure. ? ?Please read over the following fact sheets that you were given. ?Coughing and Deep Breathing, Surgical Site Infection Prevention, Anesthesia Post-op Instructions, and Care and Recovery After Surgery ?  ? ? ? Open Hernia Repair, Adult, Care After ?What can I expect after the procedure? ?After the procedure, it is common to have: ?Mild discomfort. ?Slight bruising. ?Mild swelling. ?Pain in the belly (abdomen). ?A small amount of blood from the cut from surgery (incision). ?Follow these instructions at home: ?Your doctor may give you more specific instructions. If you have problems, call your doctor. ?Medicines ?Take over-the-counter and prescription medicines only as told by your doctor. ?If told, take steps to prevent problems with pooping (constipation). You may need to: ?Drink enough fluid to keep your pee  (urine) pale yellow. ?Take medicines. You will be told what medicines to take. ?Eat foods that are high in fiber. These include beans, whole grains, and fresh fruits and vegetables. ?Limit foods that are high in fat and sugar. These include fried or sweet foods. ?Ask your doctor if you should avoid driving or using machines while you are taking your medicine. ?Incision care ? ?Follow instructions from your doctor about how to take care of your incision. Make sure you: ?Wash your hands with soap and water for at least 20 seconds before and after you change your bandage (dressing). If you cannot use soap and water, use hand sanitizer. ?Change your bandage. ?Leave stitches or skin glue in place for at least 2 weeks. ?Leave tape strips alone unless you are told to take them off. You may trim the edges of the tape strips if they curl up. ?Check your incision every day for signs of infection. Check for: ?More redness, swelling, or pain. ?More fluid or blood. ?Warmth. ?Pus or a bad smell. ?Wear loose, soft clothing while your incision heals. ?Activity ? ?Rest as told by your doctor. ?Do not lift anything that is heavier than 10 lb (4.5 kg), or the limit that you are told. ?Do not play contact sports until your doctor says that this is safe. ?If you were given a sedative during your procedure, do not drive or use machines until your doctor says  that it is safe. A sedative is a medicine that helps you relax. ?Return to your normal activities when your doctor says that it is safe. ?General instructions ?Do not take baths, swim, or use a hot tub. Ask your doctor about taking showers or sponge baths. ?Hold a pillow over your belly when you cough or sneeze. This helps with pain. ?Do not smoke or use any products that contain nicotine or tobacco. If you need help quitting, ask your doctor. ?Keep all follow-up visits. ?Contact a doctor if: ?You have any of these signs of infection in or around your incision: ?More redness,  swelling, or pain. ?More fluid or blood. ?Warmth. ?Pus. ?A bad smell. ?You have a fever or chills. ?You have blood in your poop (stool). ?You have not pooped (had a bowel movement) in 2-3 days. ?Medicine does not help your pain. ?Get help right away if: ?You have chest pain, or you are short of breath. ?You feel faint or light-headed. ?You have very bad pain. ?You vomit and your pain is worse. ?You have pain, swelling, or redness in a leg. ?These symptoms may be an emergency. Get help right away. Call your local emergency services (911 in the [place] ?Do not wait to see if the symptoms will go away. ?Do not drive yourself to the hospital. ?Summary ?After this procedure, it is common to have mild discomfort, slight bruising, and mild swelling. ?Follow instructions from your doctor about how to take care of your cut from surgery (incision). Check every day for signs of infection. ?Do not lift heavy objects or play contact sports until your doctor says it is safe. ?Return to your normal activities as told by your doctor. ?This information is not intended to replace advice given to you by your health care provider. Make sure you discuss any questions you have with your health care provider. ?Document Revised: 03/22/2020 Document Reviewed: 03/22/2020 ?Elsevier Patient Education ? Martin Oconnor. ?General Anesthesia, Adult, Care After ?This sheet gives you information about how to care for yourself after your procedure. Your health care provider may also give you more specific instructions. If you have problems or questions, contact your health care provider. ?What can I expect after the procedure? ?After the procedure, the following side effects are common: ?Pain or discomfort at the IV site. ?Nausea. ?Vomiting. ?Sore throat. ?Trouble concentrating. ?Feeling cold or chills. ?Feeling weak or tired. ?Sleepiness and fatigue. ?Soreness and body aches. These side effects can affect parts of the body that were not involved  in surgery. ?Follow these instructions at home: ?For the time period you were told by your health care provider: ? ?Rest. ?Do not participate in activities where you could fall or become injured. ?Do not drive or use machinery. ?Do not drink alcohol. ?Do not take sleeping pills or medicines that cause drowsiness. ?Do not make important decisions or sign legal documents. ?Do not take care of children on your own. ?Eating and drinking ?Follow any instructions from your health care provider about eating or drinking restrictions. ?When you feel hungry, start by eating small amounts of foods that are soft and easy to digest (bland), such as toast. Gradually return to your regular diet. ?Drink enough fluid to keep your urine pale yellow. ?If you vomit, rehydrate by drinking water, juice, or clear broth. ?General instructions ?If you have sleep apnea, surgery and certain medicines can increase your risk for breathing problems. Follow instructions from your health care provider about wearing your sleep device: ?Anytime you are  sleeping, including during daytime naps. ?While taking prescription pain medicines, sleeping medicines, or medicines that make you drowsy. ?Have a responsible adult stay with you for the time you are told. It is important to have someone help care for you until you are awake and alert. ?Return to your normal activities as told by your health care provider. Ask your health care provider what activities are safe for you. ?Take over-the-counter and prescription medicines only as told by your health care provider. ?If you smoke, do not smoke without supervision. ?Keep all follow-up visits as told by your health care provider. This is important. ?Contact a health care provider if: ?You have nausea or vomiting that does not get better with medicine. ?You cannot eat or drink without vomiting. ?You have pain that does not get better with medicine. ?You are unable to pass urine. ?You develop a skin rash. ?You  have a fever. ?You have redness around your IV site that gets worse. ?Get help right away if: ?You have difficulty breathing. ?You have chest pain. ?You have blood in your urine or stool, or you vomit blood. ?S

## 2021-11-08 ENCOUNTER — Encounter (HOSPITAL_COMMUNITY): Payer: Self-pay

## 2021-11-08 ENCOUNTER — Encounter (HOSPITAL_COMMUNITY)
Admission: RE | Admit: 2021-11-08 | Discharge: 2021-11-08 | Disposition: A | Discharge status: Home / Self Care | Payer: Managed Care, Other (non HMO) | Source: Ambulatory Visit | Attending: Surgery | Admitting: Surgery

## 2021-11-08 DIAGNOSIS — N433 Hydrocele, unspecified: Secondary | ICD-10-CM | POA: Diagnosis not present

## 2021-11-08 DIAGNOSIS — Z01810 Encounter for preprocedural cardiovascular examination: Secondary | ICD-10-CM | POA: Insufficient documentation

## 2021-11-08 DIAGNOSIS — I10 Essential (primary) hypertension: Secondary | ICD-10-CM | POA: Diagnosis not present

## 2021-11-08 DIAGNOSIS — K4090 Unilateral inguinal hernia, without obstruction or gangrene, not specified as recurrent: Secondary | ICD-10-CM | POA: Diagnosis present

## 2021-11-08 DIAGNOSIS — Z79899 Other long term (current) drug therapy: Secondary | ICD-10-CM | POA: Diagnosis not present

## 2021-11-11 ENCOUNTER — Encounter (HOSPITAL_COMMUNITY)
Admission: RE | Disposition: A | Discharge status: Home / Self Care | Payer: Self-pay | Source: Home / Self Care | Attending: Surgery

## 2021-11-11 ENCOUNTER — Other Ambulatory Visit: Payer: Self-pay

## 2021-11-11 ENCOUNTER — Ambulatory Visit (HOSPITAL_COMMUNITY)
Admission: RE | Admit: 2021-11-11 | Discharge: 2021-11-11 | Disposition: A | Discharge status: Home / Self Care | Payer: Managed Care, Other (non HMO) | Attending: Surgery | Admitting: Surgery

## 2021-11-11 ENCOUNTER — Ambulatory Visit (HOSPITAL_COMMUNITY): Payer: Managed Care, Other (non HMO) | Admitting: Anesthesiology

## 2021-11-11 ENCOUNTER — Encounter (HOSPITAL_COMMUNITY): Payer: Self-pay | Admitting: Surgery

## 2021-11-11 DIAGNOSIS — I10 Essential (primary) hypertension: Secondary | ICD-10-CM | POA: Insufficient documentation

## 2021-11-11 DIAGNOSIS — K4090 Unilateral inguinal hernia, without obstruction or gangrene, not specified as recurrent: Secondary | ICD-10-CM | POA: Diagnosis not present

## 2021-11-11 DIAGNOSIS — Z79899 Other long term (current) drug therapy: Secondary | ICD-10-CM | POA: Insufficient documentation

## 2021-11-11 DIAGNOSIS — N433 Hydrocele, unspecified: Secondary | ICD-10-CM | POA: Insufficient documentation

## 2021-11-11 HISTORY — PX: INGUINAL HERNIA REPAIR: SHX194

## 2021-11-11 SURGERY — REPAIR, HERNIA, INGUINAL, ADULT
Anesthesia: General | Site: Inguinal | Laterality: Right

## 2021-11-11 MED ORDER — OXYCODONE HCL 5 MG PO TABS: 5.0000 mg | ORAL_TABLET | Freq: Four times a day (QID) | ORAL | 0 refills | Status: AC | PRN

## 2021-11-11 MED ORDER — PHENYLEPHRINE 40 MCG/ML (10ML) SYRINGE FOR IV PUSH (FOR BLOOD PRESSURE SUPPORT)
PREFILLED_SYRINGE | INTRAVENOUS | Status: AC
  Filled 2021-11-11: qty 10

## 2021-11-11 MED ORDER — HYDROMORPHONE HCL 1 MG/ML IJ SOLN
INTRAMUSCULAR | Status: AC
  Filled 2021-11-11: qty 0.5

## 2021-11-11 MED ORDER — PROPOFOL 10 MG/ML IV BOLUS
INTRAVENOUS | Status: DC | PRN
  Administered 2021-11-11: 100 mg via INTRAVENOUS
  Administered 2021-11-11 (×2): 50 mg via INTRAVENOUS

## 2021-11-11 MED ORDER — KETOROLAC TROMETHAMINE 30 MG/ML IJ SOLN
INTRAMUSCULAR | Status: AC
  Filled 2021-11-11: qty 1

## 2021-11-11 MED ORDER — LIDOCAINE HCL (CARDIAC) PF 100 MG/5ML IV SOSY
PREFILLED_SYRINGE | INTRAVENOUS | Status: DC | PRN
  Administered 2021-11-11: 100 mg via INTRAVENOUS

## 2021-11-11 MED ORDER — LACTATED RINGERS IV SOLN: INTRAVENOUS | Status: DC

## 2021-11-11 MED ORDER — KETOROLAC TROMETHAMINE 15 MG/ML IJ SOLN
INTRAMUSCULAR | Status: DC | PRN
Start: 2021-11-11 — End: 2021-11-11
  Administered 2021-11-11: 10 mg via INTRAVENOUS

## 2021-11-11 MED ORDER — BUPIVACAINE HCL (300 MG DOSE) 3 X 100 MG IL IMPL
DRUG_IMPLANT | Status: DC | PRN
  Administered 2021-11-11: 300 mg

## 2021-11-11 MED ORDER — SODIUM CHLORIDE 0.9 % IR SOLN
Status: DC | PRN
  Administered 2021-11-11: 1000 mL

## 2021-11-11 MED ORDER — CHLORHEXIDINE GLUCONATE 0.12 % MT SOLN
15.0000 mL | Freq: Once | OROMUCOSAL | Status: AC
  Administered 2021-11-11: 15 mL via OROMUCOSAL

## 2021-11-11 MED ORDER — LIDOCAINE HCL (PF) 2 % IJ SOLN
INTRAMUSCULAR | Status: AC
  Filled 2021-11-11: qty 5

## 2021-11-11 MED ORDER — MIDAZOLAM HCL 5 MG/5ML IJ SOLN
INTRAMUSCULAR | Status: DC | PRN
Start: 2021-11-11 — End: 2021-11-11
  Administered 2021-11-11: 2 mg via INTRAVENOUS

## 2021-11-11 MED ORDER — ACETAMINOPHEN 500 MG PO TABS: 1000.0000 mg | ORAL_TABLET | Freq: Four times a day (QID) | ORAL | 0 refills | Status: AC

## 2021-11-11 MED ORDER — CHLORHEXIDINE GLUCONATE CLOTH 2 % EX PADS: 6.0000 | MEDICATED_PAD | Freq: Once | CUTANEOUS | Status: DC

## 2021-11-11 MED ORDER — DEXAMETHASONE SODIUM PHOSPHATE 10 MG/ML IJ SOLN
INTRAMUSCULAR | Status: AC
  Filled 2021-11-11: qty 1

## 2021-11-11 MED ORDER — SUGAMMADEX SODIUM 500 MG/5ML IV SOLN
INTRAVENOUS | Status: AC
  Filled 2021-11-11: qty 5

## 2021-11-11 MED ORDER — ONDANSETRON HCL 4 MG/2ML IJ SOLN
INTRAMUSCULAR | Status: AC
  Filled 2021-11-11: qty 2

## 2021-11-11 MED ORDER — DIPHENHYDRAMINE HCL 50 MG/ML IJ SOLN
INTRAMUSCULAR | Status: AC
  Filled 2021-11-11: qty 1

## 2021-11-11 MED ORDER — ONDANSETRON HCL 4 MG/2ML IJ SOLN: 4.0000 mg | Freq: Once | INTRAMUSCULAR | Status: DC | PRN

## 2021-11-11 MED ORDER — DOCUSATE SODIUM 100 MG PO CAPS: 100.0000 mg | ORAL_CAPSULE | Freq: Two times a day (BID) | ORAL | 0 refills | Status: AC

## 2021-11-11 MED ORDER — CHLORHEXIDINE GLUCONATE CLOTH 2 % EX PADS
6.0000 | MEDICATED_PAD | Freq: Once | CUTANEOUS | Status: AC
  Administered 2021-11-11: 6 via TOPICAL

## 2021-11-11 MED ORDER — MEPERIDINE HCL 50 MG/ML IJ SOLN: 6.2500 mg | INTRAMUSCULAR | Status: DC | PRN

## 2021-11-11 MED ORDER — BUPIVACAINE HCL (300 MG DOSE) 3 X 100 MG IL IMPL
DRUG_IMPLANT | Status: AC
  Filled 2021-11-11: qty 100

## 2021-11-11 MED ORDER — SEVOFLURANE IN SOLN
RESPIRATORY_TRACT | Status: AC
  Filled 2021-11-11: qty 250

## 2021-11-11 MED ORDER — MIDAZOLAM HCL 2 MG/2ML IJ SOLN
INTRAMUSCULAR | Status: AC
  Filled 2021-11-11: qty 2

## 2021-11-11 MED ORDER — PHENYLEPHRINE HCL (PRESSORS) 10 MG/ML IV SOLN
INTRAVENOUS | Status: DC | PRN
  Administered 2021-11-11: 80 ug via INTRAVENOUS
  Administered 2021-11-11: 40 ug via INTRAVENOUS
  Administered 2021-11-11 (×2): 120 ug via INTRAVENOUS

## 2021-11-11 MED ORDER — ROCURONIUM BROMIDE 10 MG/ML (PF) SYRINGE
PREFILLED_SYRINGE | INTRAVENOUS | Status: AC
  Filled 2021-11-11: qty 10

## 2021-11-11 MED ORDER — FENTANYL CITRATE (PF) 100 MCG/2ML IJ SOLN
INTRAMUSCULAR | Status: DC | PRN
  Administered 2021-11-11 (×2): 12.5 ug via INTRAVENOUS
  Administered 2021-11-11: 25 ug via INTRAVENOUS
  Administered 2021-11-11 (×4): 12.5 ug via INTRAVENOUS

## 2021-11-11 MED ORDER — ORAL CARE MOUTH RINSE: 15.0000 mL | Freq: Once | OROMUCOSAL | Status: AC

## 2021-11-11 MED ORDER — HYDROMORPHONE HCL 1 MG/ML IJ SOLN
0.2500 mg | INTRAMUSCULAR | Status: DC | PRN
  Administered 2021-11-11: 0.5 mg via INTRAVENOUS

## 2021-11-11 MED ORDER — ALBUTEROL SULFATE HFA 108 (90 BASE) MCG/ACT IN AERS
INHALATION_SPRAY | RESPIRATORY_TRACT | Status: AC
  Filled 2021-11-11: qty 6.7

## 2021-11-11 MED ORDER — CEFAZOLIN SODIUM-DEXTROSE 2-4 GM/100ML-% IV SOLN
2.0000 g | INTRAVENOUS | Status: AC
  Administered 2021-11-11: 2 g via INTRAVENOUS

## 2021-11-11 MED ORDER — DEXAMETHASONE SODIUM PHOSPHATE 10 MG/ML IJ SOLN
INTRAMUSCULAR | Status: DC | PRN
  Administered 2021-11-11: 10 mg via INTRAVENOUS

## 2021-11-11 MED ORDER — ONDANSETRON HCL 4 MG/2ML IJ SOLN
INTRAMUSCULAR | Status: DC | PRN
Start: 2021-11-11 — End: 2021-11-11
  Administered 2021-11-11: 4 mg via INTRAVENOUS

## 2021-11-11 MED ORDER — BUPIVACAINE LIPOSOME 1.3 % IJ SUSP
INTRAMUSCULAR | Status: AC
  Filled 2021-11-11: qty 20

## 2021-11-11 MED ORDER — CEFAZOLIN SODIUM-DEXTROSE 2-4 GM/100ML-% IV SOLN
INTRAVENOUS | Status: AC
  Filled 2021-11-11: qty 100

## 2021-11-11 MED ORDER — FENTANYL CITRATE (PF) 100 MCG/2ML IJ SOLN
INTRAMUSCULAR | Status: AC
Start: 2021-11-11 — End: ?
  Filled 2021-11-11: qty 2

## 2021-11-11 MED ORDER — PROPOFOL 10 MG/ML IV BOLUS
INTRAVENOUS | Status: AC
  Filled 2021-11-11: qty 20

## 2021-11-11 SURGICAL SUPPLY — 34 items
ADH SKN CLS APL DERMABOND .7 (GAUZE/BANDAGES/DRESSINGS) ×1
CLOTH BEACON ORANGE TIMEOUT ST (SAFETY) ×3 IMPLANT
COVER LIGHT HANDLE STERIS (MISCELLANEOUS) ×6 IMPLANT
DERMABOND ADVANCED (GAUZE/BANDAGES/DRESSINGS) ×1
DERMABOND ADVANCED .7 DNX12 (GAUZE/BANDAGES/DRESSINGS) ×2 IMPLANT
DRAIN PENROSE 0.5X18 (DRAIN) ×3 IMPLANT
ELECT REM PT RETURN 9FT ADLT (ELECTROSURGICAL) ×2
ELECTRODE REM PT RTRN 9FT ADLT (ELECTROSURGICAL) ×2 IMPLANT
GAUZE 4X4 16PLY ~~LOC~~+RFID DBL (SPONGE) ×2 IMPLANT
GAUZE SPONGE 4X4 12PLY STRL (GAUZE/BANDAGES/DRESSINGS) ×3 IMPLANT
GLOVE SURG ENC MOIS LTX SZ6.5 (GLOVE) ×3 IMPLANT
GLOVE SURG UNDER POLY LF SZ7 (GLOVE) ×12 IMPLANT
GOWN STRL REUS W/TWL LRG LVL3 (GOWN DISPOSABLE) ×9 IMPLANT
INST SET MINOR GENERAL (KITS) ×3 IMPLANT
KIT TURNOVER KIT A (KITS) ×3 IMPLANT
MANIFOLD NEPTUNE II (INSTRUMENTS) ×3 IMPLANT
MESH PRE-SHAPE KEYHOLE 1.8X4 (Mesh General) ×1 IMPLANT
NDL HYPO 18GX1.5 BLUNT FILL (NEEDLE) ×2 IMPLANT
NDL HYPO 21X1.5 SAFETY (NEEDLE) ×2 IMPLANT
NEEDLE HYPO 18GX1.5 BLUNT FILL (NEEDLE) ×2 IMPLANT
NEEDLE HYPO 21X1.5 SAFETY (NEEDLE) ×2 IMPLANT
NS IRRIG 1000ML POUR BTL (IV SOLUTION) ×3 IMPLANT
PACK MINOR (CUSTOM PROCEDURE TRAY) ×3 IMPLANT
PAD ARMBOARD 7.5X6 YLW CONV (MISCELLANEOUS) ×3 IMPLANT
SET BASIN LINEN APH (SET/KITS/TRAYS/PACK) ×3 IMPLANT
SOL PREP PROV IODINE SCRUB 4OZ (MISCELLANEOUS) ×3 IMPLANT
SUT MNCRL AB 4-0 PS2 18 (SUTURE) ×3 IMPLANT
SUT NOVA NAB GS-22 2 2-0 T-19 (SUTURE) ×3 IMPLANT
SUT SILK 0 FSL (SUTURE) ×1 IMPLANT
SUT VIC AB 2-0 CT1 27 (SUTURE) ×4
SUT VIC AB 2-0 CT1 TAPERPNT 27 (SUTURE) ×2 IMPLANT
SUT VIC AB 3-0 SH 27 (SUTURE) ×4
SUT VIC AB 3-0 SH 27X BRD (SUTURE) ×2 IMPLANT
SYR 20ML LL LF (SYRINGE) ×3 IMPLANT

## 2021-11-11 NOTE — Anesthesia Preprocedure Evaluation (Signed)
Anesthesia Evaluation  ?Patient identified by MRN, date of birth, ID band ?Patient awake ? ? ? ?Reviewed: ?Allergy & Precautions, NPO status , Patient's Chart, lab work & pertinent test results ? ?Airway ?Mallampati: II ? ?TM Distance: >3 FB ?Neck ROM: Full ? ? ? Dental ? ?(+) Dental Advisory Given, Chipped,  ?  ?Pulmonary ?neg pulmonary ROS,  ?  ?Pulmonary exam normal ?breath sounds clear to auscultation ? ? ? ? ? ? Cardiovascular ?Exercise Tolerance: Good ?hypertension, Pt. on medications ?Normal cardiovascular exam ?Rhythm:Regular Rate:Normal ? ? ?  ?Neuro/Psych ?negative neurological ROS ? negative psych ROS  ? GI/Hepatic ?negative GI ROS, Neg liver ROS,   ?Endo/Other  ?negative endocrine ROS ? Renal/GU ?negative Renal ROS  ?negative genitourinary ?  ?Musculoskeletal ?negative musculoskeletal ROS ?(+)  ? Abdominal ?  ?Peds ?negative pediatric ROS ?(+)  Hematology ?negative hematology ROS ?(+)   ?Anesthesia Other Findings ? ? Reproductive/Obstetrics ?negative OB ROS ? ?  ? ? ? ? ? ? ? ? ? ? ? ? ? ?  ?  ? ? ? ? ? ? ? ?Anesthesia Physical ?Anesthesia Plan ? ?ASA: 1 ? ?Anesthesia Plan: General  ? ?Post-op Pain Management: Dilaudid IV  ? ?Induction: Intravenous ? ?PONV Risk Score and Plan: 3 and Ondansetron and Dexamethasone ? ?Airway Management Planned: LMA ? ?Additional Equipment:  ? ?Intra-op Plan:  ? ?Post-operative Plan: Extubation in OR ? ?Informed Consent: I have reviewed the patients History and Physical, chart, labs and discussed the procedure including the risks, benefits and alternatives for the proposed anesthesia with the patient or authorized representative who has indicated his/her understanding and acceptance.  ? ? ? ? ? ?Plan Discussed with: CRNA and Surgeon ? ?Anesthesia Plan Comments:   ? ? ? ? ? ?Anesthesia Quick Evaluation ? ?

## 2021-11-11 NOTE — Progress Notes (Signed)
Spoke with the patient's daughter in the consultation room.  It was explained that he had a right inguinal hernia and an associated hydrocele.  I repaired the inguinal hernia and resected as much of the hydrocele as I could.  He tolerated the procedure well.  I further explained that I would be discharging him home with pain medications, and I recommend that he takes scheduled Tylenol.  He will follow up with me in 2 weeks, and he should be good to go back to work with light duty after this visit.  All questions were answered to her expressed satisfaction. ? ?Graciella Freer, DO ?[facility] ?[place], [place] [address] ?[phone] (office) ? ?

## 2021-11-11 NOTE — Anesthesia Procedure Notes (Signed)
Procedure Name: LMA Insertion ?Date/Time: 11/11/2021 7:39 AM ?Performed by: Cy Blamer, CRNA ?Pre-anesthesia Checklist: Patient identified, Emergency Drugs available, Suction available, Patient being monitored and Timeout performed ?Patient Re-evaluated:Patient Re-evaluated prior to induction ?Oxygen Delivery Method: Circle system utilized ?Preoxygenation: Pre-oxygenation with 100% oxygen ?Induction Type: IV induction ?LMA: LMA inserted ?LMA Size: 5.0 ?Number of attempts: 1 ?Tube secured with: Tape ?Dental Injury: Teeth and Oropharynx as per pre-operative assessment  ? ? ? ? ?

## 2021-11-11 NOTE — Interval H&P Note (Signed)
History and Physical Interval Note: ? ?11/11/2021 ?7:25 AM ? ?Martin Oconnor  has presented today for surgery, with the diagnosis of Right inguinal hernia.  The various methods of treatment have been discussed with the patient and family. After consideration of risks, benefits and other options for treatment, the patient has consented to  Procedure(s): ?HERNIA REPAIR INGUINAL ADULT; OPEN; W/MESH (Right) as a surgical intervention.  The patient's history has been reviewed, patient examined, no change in status, stable for surgery.  I have reviewed the patient's chart and labs.  Questions were answered to the patient's satisfaction.   ? ? ?Mckennah Kretchmer A Dray Dente ? ? ?

## 2021-11-11 NOTE — Discharge Instructions (Signed)
Ambulatory Surgery Discharge Instructions  General Anesthesia or Sedation Do not drive or operate heavy machinery for 24 hours.  Do not consume alcohol, tranquilizers, sleeping medications, or any non-prescribed medications for 24 hours. Do not make important decisions or sign any important papers in the next 24 hours. You should have someone with you tonight at home.  Activity  You are advised to go directly home from the hospital.  Restrict your activities and rest for a day.  Resume light activity tomorrow. No heavy lifting over 10 lbs or strenuous exercise.  Fluids and Diet Begin with clear liquids, bouillon, dry toast, soda crackers.  If not nauseated, you may go to a regular diet when you desire.  Greasy and spicy foods are not advised.  Medications  If you have not had a bowel movement in 24 hours, take 2 tablespoons over the counter Milk of mag.             You May resume your blood thinners tomorrow (Aspirin, coumadin, or other).  You are being discharged with prescriptions for Opioid/Narcotic Medications: There are some specific considerations for these medications that you should know. Opioid Meds have risks & benefits. Addiction to these meds is always a concern with prolonged use Take medication only as directed Do not drive while taking narcotic pain medication Do not crush tablets or capsules Do not use a different container than medication was dispensed in Lock the container of medication in a cool, dry place out of reach of children and pets. Opioid medication can cause addiction Do not share with anyone else (this is a felony) Do not store medications for future use. Dispose of them properly.     Disposal:  Find a [facility] household drug take back site near you.  If you can't get to a drug take back site, use the recipe below as a last resort to dispose of expired, unused or unwanted drugs. Disposal  (Do not dispose chemotherapy drugs this way, talk to your  prescribing doctor instead.) Step 1: Mix drugs (do not crush) with dirt, kitty litter, or used coffee grounds and add a small amount of water to dissolve any solid medications. Step 2: Seal drugs in plastic bag. Step 3: Place plastic bag in trash. Step 4: Take prescription container and scratch out personal information, then recycle or throw away.  Operative Site  You have a liquid bandage over your incisions, this will begin to flake off in about a week. Ok to shower tomorrow. Keep wound clean and dry. No baths or swimming. No lifting more than 10 pounds.  Contact Information: If you have questions or concerns, please call our office, [phone], Monday- Thursday 8AM-5PM and Friday 8AM-12Noon.  If it is after hours or on the weekend, please call [facility] Main Number, [phone], and ask to speak to the surgeon on call for Dr. Baldemar Dady at [facility].   SPECIFIC COMPLICATIONS TO WATCH FOR: Inability to urinate Fever over 101? F by mouth Nausea and vomiting lasting longer than 24 hours. Pain not relieved by medication ordered Swelling around the operative site Increased redness, warmth, hardness, around operative area Numbness, tingling, or cold fingers or toes Blood -soaked dressing, (small amounts of oozing may be normal) Increasing and progressive drainage from surgical area or exam site  

## 2021-11-11 NOTE — Transfer of Care (Signed)
Immediate Anesthesia Transfer of Care Note ? ?Patient: Martin Oconnor ? ?Procedure(s) Performed: HERNIA REPAIR INGUINAL ADULT; OPEN; W/MESH (Right: Inguinal) ? ?Patient Location: PACU ? ?Anesthesia Type:General ? ?Level of Consciousness: awake, alert  and oriented ? ?Airway & Oxygen Therapy: Patient Spontanous Breathing and Patient connected to nasal cannula oxygen ? ?Post-op Assessment: Report given to RN, Post -op Vital signs reviewed and stable, Patient moving all extremities X 4 and Patient able to stick tongue midline ? ?Post vital signs: Reviewed ? ?Last Vitals:  ?Vitals Value Taken Time  ?BP 131/93   ?Temp 97.4   ?Pulse 72 11/11/21 0944  ?Resp 15 11/11/21 0944  ?SpO2 100 % 11/11/21 0944  ?Vitals shown include unvalidated device data. ? ?Last Pain:  ?Vitals:  ? 11/11/21 0646  ?TempSrc: Oral  ?PainSc: 0-No pain  ?   ? ?  ? ?Complications: No notable events documented. ?

## 2021-11-11 NOTE — Op Note (Signed)
[facility] ?Operative Note ? ?11/11/21 ? ?Preoperative Diagnosis: Right inguinal hernia  ?  ?Postoperative Diagnosis: Same ?  ?Procedure(s) Performed: Right inguinal hernia repair with mesh ?  ?Surgeon: Theophilus Kinds, DO  ?  ?Assistants: Westly Pam, RN ?  ?Anesthesia: General endotracheal ?  ?Anesthesiologist: Molli Barrows, MD  ?  ?Specimens: Right inguinal hernia sac and hydrocele  ?  ?Estimated Blood Loss: Minimal ?  ?Blood Replacement: None  ?  ?Complications: None  ? ?Wound Class: Clean ?  ?Operative Indications: Patient is a 46 year old male who presents for open right inguinal hernia.  He has had this hernia present for 4 months, and he states that he has significant pain associated with the hernia, especially with lifting.  He would like his hernia repaired at this time. ? ?All risks, benefits, and alternatives to open right inguinal hernia repair with mesh were discussed with the patient, all of his questions were answered to his expressed satisfaction. The patient expresses he wishes to proceed, and informed consent was obtained. ? ?Findings: Right inguinal hernia with hydrocele of the cord ?  ?Procedure: The patient was taken to the operating room and placed supine. General endotracheal anesthesia was induced. Intravenous antibiotics were  administered per protocol.  A time out was preformed verifying the correct patient, procedure, site, positioning and implants.  The right groin and scrotum were prepared and draped in the usual sterile fashion.  ? ?An incision was made in a natural skin crease between the pubic tubercle and the anterior superior iliac spine.  The incision was deepened with electrocautery through Scarpa's and Camper's fascia until the aponeurosis of the external oblique was encountered.  This was cleaned and the external ring was exposed.  An incision was made in the midportion of the external oblique aponeurosis in the direction of its fibers. The  ilioinguinal nerve was   not identified.  Flaps of the external oblique were developed cephalad and inferiorly.   ? ?The cord was identified and it was gently dissented free at the pubic tubercle and encircled with a Penrose drain.  Attention was then directed at the anteromedial aspect of the cord, where a hydrocele was noted.  After this was dissected off the cord, the indirect hernia sac was identified.  The sac was carefully dissected free from the cord down to the level of the internal ring.  The vas and testicular vessels were identified and protected from harm.  Once the sac was dissected free from the cords, the Penrose was placed around the cord which was retracted inferiorly out of the field of view.  The hernia sac was resected and the opening was suture ligated and reduced into the internal ring without difficulty.  Attention was then turned to the floor of the canal, which was grossly weakened without any defined defect or sac.  A large Bard Keyhole Mesh was sutured to the inguinal ligament inferiorly starting at the pubic tubercle using 2-0 Novafil interrupted sutures.  The mesh was sutured superiorly to the conjoint tendon using 2-0 Novafil interrupted sutures.  Care was taken to ensure the mesh was placed in a relaxed fashion to avoid excessive tension and no neurovascular structures were caught in the repair.  Laterally the tails of the mesh were crossed and the internal ring was recreated, allowing for passage of cords without tension.  ? ?Hemostasis was adequate.  The Penrose was removed. The remnant hydrocele tissue was sutured closed distally. The external oblique aponeurosis was closed  with a 2-0 Vicryl suture in a running fashion.  Scarpa's fashion was closed with 3-0 Vicryl running suture, and the deep dermis was closed with interrupted 3-0 Vicryl. The skin was closed with a subcuticular 4-0 Monocryl suture.  Dermabond was applied.  ? ?The testis was gently pulled down into its anatomic  position in the scrotum. ? ?The patient tolerated the procedure well and was taken to the PACU in stable condition. All counts were correct at the end of the case.  ?   ?  ?Marian Grandt, DO  ?[facility] ?[place], [place] [address] ?[phone] (office) ? ? ? ?

## 2021-11-11 NOTE — Anesthesia Postprocedure Evaluation (Signed)
Anesthesia Post Note ? ?Patient: Martin Oconnor ? ?Procedure(s) Performed: HERNIA REPAIR INGUINAL ADULT; OPEN; W/MESH (Right: Inguinal) ? ?Patient location during evaluation: Phase II ?Anesthesia Type: General ?Level of consciousness: awake and alert and oriented ?Pain management: pain level controlled ?Vital Signs Assessment: post-procedure vital signs reviewed and stable ?Respiratory status: spontaneous breathing, nonlabored ventilation and respiratory function stable ?Cardiovascular status: blood pressure returned to baseline and stable ?Postop Assessment: no apparent nausea or vomiting ?Anesthetic complications: no ? ? ?No notable events documented. ? ? ?Last Vitals:  ?Vitals:  ? 11/11/21 1030 11/11/21 1055  ?BP: (!) 140/99 (!) 159/108  ?Pulse: 79 78  ?Resp: 16 18  ?Temp:  36.8 ?C  ?SpO2: 100% 100%  ?  ?Last Pain:  ?Vitals:  ? 11/11/21 1055  ?TempSrc: Oral  ?PainSc: 6   ? ? ?  ?  ?  ?  ?  ?  ? ?Carlisa Eble C Georgie Eduardo ? ? ? ? ?

## 2021-11-14 LAB — SURGICAL PATHOLOGY

## 2021-11-16 ENCOUNTER — Telehealth: Payer: Self-pay | Admitting: Family Medicine

## 2021-11-16 NOTE — Telephone Encounter (Signed)
FMLA & STD Paperwork completed and emailed to addy patient left.  ? ?Out of work 11/11/2021 with a return to work date of 12/26/2021. ?

## 2021-11-17 ENCOUNTER — Encounter (HOSPITAL_COMMUNITY): Payer: Self-pay | Admitting: Surgery

## 2021-11-24 ENCOUNTER — Encounter: Payer: Self-pay | Admitting: Surgery

## 2021-11-24 ENCOUNTER — Other Ambulatory Visit: Payer: Self-pay

## 2021-11-24 ENCOUNTER — Ambulatory Visit (INDEPENDENT_AMBULATORY_CARE_PROVIDER_SITE_OTHER): Payer: Managed Care, Other (non HMO) | Admitting: Surgery

## 2021-11-24 VITALS — BP 148/94 | HR 77 | Temp 97.6°F | Resp 14 | Ht 63.0 in | Wt 132.0 lb

## 2021-11-24 DIAGNOSIS — Z09 Encounter for follow-up examination after completed treatment for conditions other than malignant neoplasm: Secondary | ICD-10-CM

## 2021-11-24 NOTE — Progress Notes (Signed)
[facility] Note  ? ?HPI:  ?46 y.o. Male presents to clinic for post-op follow-up status post open right inguinal hernia repair with mesh on 3/24. Patient reports that he has been doing well since surgery, though still has some right groin tenderness especially when getting up and down.  He also notes that he has some swelling.  He has been getting up and moving.  He walks around his property multiple times a day.  He is tolerating a diet without nausea and vomiting.  And he is moving his bowels without issue. ? ?Review of Systems:  ?All other review of systems: otherwise negative  ? ?Vital Signs:  ?BP (!) 148/94   Pulse 77   Temp 97.6 ?F (36.4 ?C) (Other (Comment))   Resp 14   Ht 5\' 3"  (1.6 m)   Wt 132 lb (59.9 kg)   SpO2 98%   BMI 23.38 kg/m?   ? ?Physical Exam:  ?Physical Exam ?Vitals reviewed.  ?Constitutional:   ?   Appearance: Normal appearance.  ?Abdominal:  ?   Comments: Abdomen soft, nondistended, nontender to percussion and palpation; right inguinal incision site healing well without erythema or drainage, skin glue in place, underlying fullness that is tender to palpation, likely postoperative hematoma  ?Neurological:  ?   Mental Status: He is alert.  ? ? ?Laboratory studies: None ? ?Imaging:  ?None ? ?Pathology: ?A. HERNIA, INGUINAL, RIGHT, BIOPSY:  ?- Hernia sac with fibrosis and hyperemia.  ? ?Assessment:  ?46 y.o. yo Male who presents for follow-up status post open right inguinal hernia repair with mesh on 3/24. ? ?Plan:  ?-Patient is doing well, but still significant tenderness in his groin with up-and-down movement. ?-Recommend the patient hold off on going back to work at this time, as he will need to be up standing getting up and down from a seated position multiple times ?-Advised patient to ice is right groin in the evenings ?-Further advised that walking is good but I do not want him to overexert himself ?-Take Tylenol as needed for pain ?-Patient will follow-up with me  in 2 weeks prior to return to work ? ?All of the above recommendations were discussed with the patient, and all of patient's questions were answered to his expressed satisfaction. ? ?Graciella Freer, DO ?[facility] ?[place], [place] [address] ?[phone] (office) ? ? ?

## 2021-11-24 NOTE — Patient Instructions (Signed)
-  Ice your groin when it is more tender ?

## 2021-12-13 ENCOUNTER — Ambulatory Visit (INDEPENDENT_AMBULATORY_CARE_PROVIDER_SITE_OTHER): Payer: Managed Care, Other (non HMO) | Admitting: Surgery

## 2021-12-13 DIAGNOSIS — Z09 Encounter for follow-up examination after completed treatment for conditions other than malignant neoplasm: Secondary | ICD-10-CM

## 2021-12-13 NOTE — Progress Notes (Signed)
[facility] ? ?I am calling the patient for post operative evaluation. This is not a billable encounter as it is under the global charges for the surgery.  The patient had a right inguinal hernia repair with mesh on 3/24. The patient reports that he has been doing well since his last follow-up.  His right groin pain has improved, and the swelling has gone down.  The swelling is most prominent in the the mornings after he gets up.  He has been unable to ice his groin for the last couple of days.  He is tolerating a diet without nausea and vomiting, and is moving his bowels without issue.  He denies any issues at his surgical site.  I advised him to take Tylenol as needed for pain, and to ice his groin in the morning in the evenings.  If he is still having significant pain in 4 weeks, advised him to make an appointment to follow back up with me.  [facility] ? ?Will see the patient PRN.  ? ?Theophilus Kinds, DO ?[facility] ?[address] ?[place], [place] [address] ?[phone] (office) ? ?

## 2021-12-27 ENCOUNTER — Telehealth: Payer: Self-pay | Admitting: *Deleted

## 2021-12-27 NOTE — Telephone Encounter (Signed)
Received call from patient daughter, Martin Oconnor [phone]~ telephone.  ? ?Requested extension on FMLA for patient as he continues to have swelling and pain. Advised that FMLA cannot be extend without appointment to evaluate per Dr. Robyne Peers. Martin Oconnor reports that patient remains incarcerated at this time.  ? ?Due to safety concerns, [facility] will need to schedule appointment for evaluation as family members/ incarcerated patient cannot have prior knowledge of any leave from incarceration.  ? ?Advised to have patient discuss issues with nurses at facility and to request appointment if needed.  ? ?Verbalized understanding.  ?

## 2022-07-28 ENCOUNTER — Telehealth: Payer: Self-pay | Admitting: *Deleted

## 2022-07-28 NOTE — Telephone Encounter (Signed)
Received fax from [facility]~ [phone]~ telephone/ [phone]~ fax.   Electronically signed HIPPA Compliant Authorization for Release of Medical Information enclosed with date of 07/28/2022.  Medical records requested: chart notes from 10/2021- present   Records from [facility] faxed to [facility].

## 2022-08-01 NOTE — Telephone Encounter (Signed)
Confirmation received.

## 2024-03-24 ENCOUNTER — Ambulatory Visit: Admitting: Urology

## 2024-04-11 ENCOUNTER — Ambulatory Visit: Admitting: Urology

## 2024-04-11 DIAGNOSIS — R972 Elevated prostate specific antigen [PSA]: Secondary | ICD-10-CM
